# Patient Record
Sex: Male | Born: 1975 | Race: White | Hispanic: No | Marital: Single | Smoking: Current every day smoker
Health system: Southern US, Community
[De-identification: ages and names within clinical notes are randomized; demographics above are authoritative.]

## PROBLEM LIST (undated history)

## (undated) DIAGNOSIS — T7840XA Allergy, unspecified, initial encounter: Secondary | ICD-10-CM

---

## 2014-01-01 ENCOUNTER — Ambulatory Visit: Payer: Self-pay | Admitting: Family Medicine

## 2014-11-16 ENCOUNTER — Ambulatory Visit: Payer: Self-pay | Admitting: Physician Assistant

## 2014-11-16 LAB — RAPID STREP-A WITH REFLX: Micro Text Report: NEGATIVE

## 2014-11-19 LAB — BETA STREP CULTURE(ARMC)

## 2015-08-04 ENCOUNTER — Encounter: Payer: Self-pay | Admitting: Emergency Medicine

## 2015-08-04 ENCOUNTER — Ambulatory Visit
Admission: EM | Admit: 2015-08-04 | Discharge: 2015-08-04 | Disposition: A | Discharge status: Home / Self Care | Payer: BLUE CROSS/BLUE SHIELD | Attending: Family Medicine | Admitting: Family Medicine

## 2015-08-04 DIAGNOSIS — H6593 Unspecified nonsuppurative otitis media, bilateral: Secondary | ICD-10-CM | POA: Diagnosis not present

## 2015-08-04 DIAGNOSIS — J302 Other seasonal allergic rhinitis: Secondary | ICD-10-CM

## 2015-08-04 DIAGNOSIS — Z8709 Personal history of other diseases of the respiratory system: Secondary | ICD-10-CM

## 2015-08-04 DIAGNOSIS — J40 Bronchitis, not specified as acute or chronic: Secondary | ICD-10-CM | POA: Diagnosis not present

## 2015-08-04 MED ORDER — SALINE SPRAY 0.65 % NA SOLN: 2.0000 | NASAL | Status: DC

## 2015-08-04 MED ORDER — ALBUTEROL SULFATE HFA 108 (90 BASE) MCG/ACT IN AERS: 1.0000 | INHALATION_SPRAY | RESPIRATORY_TRACT | Status: DC | PRN

## 2015-08-04 MED ORDER — IBUPROFEN 800 MG PO TABS: 800.0000 mg | ORAL_TABLET | Freq: Three times a day (TID) | ORAL | Status: DC

## 2015-08-04 MED ORDER — PSEUDOEPHEDRINE HCL 30 MG PO TABS: 30.0000 mg | ORAL_TABLET | Freq: Four times a day (QID) | ORAL | Status: DC | PRN

## 2015-08-04 MED ORDER — AMOXICILLIN-POT CLAVULANATE 875-125 MG PO TABS: 1.0000 | ORAL_TABLET | Freq: Two times a day (BID) | ORAL | Status: DC

## 2015-08-04 MED ORDER — TRIAMCINOLONE ACETONIDE 55 MCG/ACT NA AERO: 2.0000 | INHALATION_SPRAY | Freq: Every day | NASAL | Status: DC

## 2015-08-04 MED ORDER — PREDNISONE 50 MG PO TABS: ORAL_TABLET | ORAL | Status: DC

## 2015-08-04 NOTE — Discharge Instructions (Signed)
Otitis Media With Effusion Otitis media with effusion is the presence of fluid in the middle ear. This is a common problem in children, which often follows ear infections. It may be present for weeks or longer after the infection. Unlike an acute ear infection, otitis media with effusion refers only to fluid behind the ear drum and not infection. Children with repeated ear and sinus infections and allergy problems are the most likely to get otitis media with effusion. CAUSES  The most frequent cause of the fluid buildup is dysfunction of the eustachian tubes. These are the tubes that drain fluid in the ears to the back of the nose (nasopharynx). SYMPTOMS   The main symptom of this condition is hearing loss. As a result, you or your child may:  Listen to the TV at a loud volume.  Not respond to questions.  Ask "what" often when spoken to.  Mistake or confuse one sound or word for another.  There may be a sensation of fullness or pressure but usually not pain. DIAGNOSIS   Your health care provider will diagnose this condition by examining you or your child's ears.  Your health care provider may test the pressure in you or your child's ear with a tympanometer.  A hearing test may be conducted if the problem persists. TREATMENT   Treatment depends on the duration and the effects of the effusion.  Antibiotics, decongestants, nose drops, and cortisone-type drugs (tablets or nasal spray) may not be helpful.  Children with persistent ear effusions may have delayed language or behavioral problems. Children at risk for developmental delays in hearing, learning, and speech may require referral to a specialist earlier than children not at risk.  You or your child's health care provider may suggest a referral to an ear, nose, and throat surgeon for treatment. The following may help restore normal hearing:  Drainage of fluid.  Placement of ear tubes (tympanostomy tubes).  Removal of adenoids  (adenoidectomy). HOME CARE INSTRUCTIONS   Avoid secondhand smoke.  Infants who are breastfed are less likely to have this condition.  Avoid feeding infants while they are lying flat.  Avoid known environmental allergens.  Avoid people who are sick. SEEK MEDICAL CARE IF:   Hearing is not better in 3 months.  Hearing is worse.  Ear pain.  Drainage from the ear.  Dizziness. MAKE SURE YOU:   Understand these instructions.  Will watch your condition.  Will get help right away if you are not doing well or get worse. Document Released: 12/19/2004 Document Revised: 03/28/2014 Document Reviewed: 06/08/2013 [facility] Patient Information 2015 [place], [place]. This information is not intended to replace advice given to you by your health care provider. Make sure you discuss any questions you have with your health care provider. Allergic Rhinitis Allergic rhinitis is when the mucous membranes in the nose respond to allergens. Allergens are particles in the air that cause your body to have an allergic reaction. This causes you to release allergic antibodies. Through a chain of events, these eventually cause you to release histamine into the blood stream. Although meant to protect the body, it is this release of histamine that causes your discomfort, such as frequent sneezing, congestion, and an itchy, runny nose.  CAUSES  Seasonal allergic rhinitis (hay fever) is caused by pollen allergens that may come from grasses, trees, and weeds. Year-round allergic rhinitis (perennial allergic rhinitis) is caused by allergens such as house dust mites, pet dander, and mold spores.  SYMPTOMS   Nasal  stuffiness (congestion).  Itchy, runny nose with sneezing and tearing of the eyes. DIAGNOSIS  Your health care provider can help you determine the allergen or allergens that trigger your symptoms. If you and your health care provider are unable to determine the allergen, skin or blood testing may be  used. TREATMENT  Allergic rhinitis does not have a cure, but it can be controlled by:  Medicines and allergy shots (immunotherapy).  Avoiding the allergen. Hay fever may often be treated with antihistamines in pill or nasal spray forms. Antihistamines block the effects of histamine. There are over-the-counter medicines that may help with nasal congestion and swelling around the eyes. Check with your health care provider before taking or giving this medicine.  If avoiding the allergen or the medicine prescribed do not work, there are many new medicines your health care provider can prescribe. Stronger medicine may be used if initial measures are ineffective. Desensitizing injections can be used if medicine and avoidance does not work. Desensitization is when a patient is given ongoing shots until the body becomes less sensitive to the allergen. Make sure you follow up with your health care provider if problems continue. HOME CARE INSTRUCTIONS It is not possible to completely avoid allergens, but you can reduce your symptoms by taking steps to limit your exposure to them. It helps to know exactly what you are allergic to so that you can avoid your specific triggers. SEEK MEDICAL CARE IF:   You have a fever.  You develop a cough that does not stop easily (persistent).  You have shortness of breath.  You start wheezing.  Symptoms interfere with normal daily activities. Document Released: 08/06/2001 Document Revised: 11/16/2013 Document Reviewed: 07/19/2013 [facility] Patient Information 2015 [place], [place]. This information is not intended to replace advice given to you by your health care provider. Make sure you discuss any questions you have with your health care provider. Acute Bronchitis Bronchitis is inflammation of the airways that extend from the windpipe into the lungs (bronchi). The inflammation often causes mucus to develop. This leads to a cough, which is the most common symptom of  bronchitis.  In acute bronchitis, the condition usually develops suddenly and goes away over time, usually in a couple weeks. Smoking, allergies, and asthma can make bronchitis worse. Repeated episodes of bronchitis may cause further lung problems.  CAUSES Acute bronchitis is most often caused by the same virus that causes a cold. The virus can spread from person to person (contagious) through coughing, sneezing, and touching contaminated objects. SIGNS AND SYMPTOMS   Cough.   Fever.   Coughing up mucus.   Body aches.   Chest congestion.   Chills.   Shortness of breath.   Sore throat.  DIAGNOSIS  Acute bronchitis is usually diagnosed through a physical exam. Your health care provider will also ask you questions about your medical history. Tests, such as chest X-rays, are sometimes done to rule out other conditions.  TREATMENT  Acute bronchitis usually goes away in a couple weeks. Oftentimes, no medical treatment is necessary. Medicines are sometimes given for relief of fever or cough. Antibiotic medicines are usually not needed but may be prescribed in certain situations. In some cases, an inhaler may be recommended to help reduce shortness of breath and control the cough. A cool mist vaporizer may also be used to help thin bronchial secretions and make it easier to clear the chest.  HOME CARE INSTRUCTIONS  Get plenty of rest.   Drink enough fluids to keep  your urine clear or pale yellow (unless you have a medical condition that requires fluid restriction). Increasing fluids may help thin your respiratory secretions (sputum) and reduce chest congestion, and it will prevent dehydration.   Take medicines only as directed by your health care provider.  If you were prescribed an antibiotic medicine, finish it all even if you start to feel better.  Avoid smoking and secondhand smoke. Exposure to cigarette smoke or irritating chemicals will make bronchitis worse. If you are a  smoker, consider using nicotine gum or skin patches to help control withdrawal symptoms. Quitting smoking will help your lungs heal faster.   Reduce the chances of another bout of acute bronchitis by washing your hands frequently, avoiding people with cold symptoms, and trying not to touch your hands to your mouth, nose, or eyes.   Keep all follow-up visits as directed by your health care provider.  SEEK MEDICAL CARE IF: Your symptoms do not improve after 1 week of treatment.  SEEK IMMEDIATE MEDICAL CARE IF:  You develop an increased fever or chills.   You have chest pain.   You have severe shortness of breath.  You have bloody sputum.   You develop dehydration.  You faint or repeatedly feel like you are going to pass out.  You develop repeated vomiting.  You develop a severe headache. MAKE SURE YOU:   Understand these instructions.  Will watch your condition.  Will get help right away if you are not doing well or get worse. Document Released: 12/19/2004 Document Revised: 03/28/2014 Document Reviewed: 05/04/2013 [facility] Patient Information 2015 [place], [place]. This information is not intended to replace advice given to you by your health care provider. Make sure you discuss any questions you have with your health care provider.

## 2015-08-04 NOTE — ED Notes (Signed)
Patient c/o cough and chest congestion and nasal congestion for over a week.  Patient denies fevers.

## 2015-08-05 NOTE — ED Provider Notes (Signed)
CSN: [id number]     Arrival date & time 08/04/15  1252 History   First MD Initiated Contact with Patient 08/04/15 1415     Chief Complaint  Patient presents with  . Cough   (Consider location/radiation/quality/duration/timing/severity/associated sxs/prior Treatment) HPI Comments: Married caucasian male here for evaluation of productive cough green white sputum; cough chest congestion starting three weeks ago nonproductive, runny nose tried zyrtec and sudafed helps to slow down drip but not stop it completely.  History seasonal allergies spring and fall; asthma as child hasn't recently used inhaler but noticed the past couple of years worsening chest symptoms with allergies; 40 year old recently started daycare and has been sick.  Patient electric lineman works outdoors has been sweating but unsure if due to protective gear/weather or infection as heat index 100s  Patient is a 39 y.o. male presenting with cough. The history is provided by the patient.  Cough Cough characteristics:  Productive Sputum characteristics:  Green and white Severity:  Moderate Onset quality:  Gradual Duration:  3 weeks Timing:  Intermittent Progression:  Unchanged Chronicity:  New Smoker: no   Context: animal exposure, exposure to allergens, fumes, occupational exposure, sick contacts, upper respiratory infection, weather changes and with activity   Context: not smoke exposure   Relieved by:  Nothing Worsened by:  Environmental changes and activity Ineffective treatments:  Decongestant, fluids, rest and cough suppressants Associated symptoms: diaphoresis, ear fullness, headaches, myalgias, rhinorrhea, sinus congestion and sore throat   Associated symptoms: no chest pain, no chills, no ear pain, no eye discharge, no fever, no rash, no shortness of breath, no weight loss and no wheezing   Headaches:    Severity:  Mild   Onset quality:  Gradual   Duration:  1 week   Timing:  Intermittent   Progression:   Unchanged   Chronicity:  New Myalgias:    Location:  Back   Quality:  Aching and stabbing   Severity:  Moderate   Onset quality:  Sudden   Duration:  2 days   Timing:  Intermittent   Progression:  Resolved Rhinorrhea:    Quality:  Green, white, yellow and clear   Severity:  Moderate   Duration:  2 weeks   Timing:  Intermittent   Progression:  Waxing and waning Sore throat:    Severity:  Mild   Onset quality:  Sudden   Duration:  1 week   Timing:  Intermittent   Progression:  Unchanged Risk factors: recent infection   Risk factors: no chemical exposure and no recent travel     History reviewed. No pertinent past medical history. History reviewed. No pertinent past surgical history. History reviewed. No pertinent family history. Social History  Substance Use Topics  . Smoking status: Current Every Day Smoker -- 0.50 packs/day    Types: Cigarettes  . Smokeless tobacco: None  . Alcohol Use: Yes    Review of Systems  Constitutional: Positive for diaphoresis and appetite change. Negative for fever, chills, weight loss, activity change and fatigue.  HENT: Positive for congestion, postnasal drip, rhinorrhea, sinus pressure, sneezing and sore throat. Negative for dental problem, drooling, ear discharge, ear pain, facial swelling, hearing loss, mouth sores, nosebleeds, tinnitus, trouble swallowing and voice change.   Eyes: Negative for photophobia, pain, discharge, redness, itching and visual disturbance.  Respiratory: Positive for cough and chest tightness. Negative for choking, shortness of breath, wheezing and stridor.   Cardiovascular: Negative for chest pain, palpitations and leg swelling.  Gastrointestinal: Negative for nausea,  vomiting, abdominal pain, diarrhea, constipation, blood in stool and abdominal distention.  Endocrine: Negative for cold intolerance and heat intolerance.  Genitourinary: Negative for dysuria, frequency and hematuria.  Musculoskeletal: Positive for  myalgias. Negative for back pain, joint swelling, arthralgias, gait problem, neck pain and neck stiffness.  Skin: Negative for color change, pallor, rash and wound.  Allergic/Immunologic: Positive for environmental allergies. Negative for food allergies.  Neurological: Positive for headaches. Negative for dizziness, tremors, seizures, syncope, facial asymmetry, speech difficulty, weakness, light-headedness and numbness.  Hematological: Negative for adenopathy. Does not bruise/bleed easily.  Psychiatric/Behavioral: Negative for behavioral problems, confusion, sleep disturbance and agitation.    Allergies  Review of patient's allergies indicates no known allergies.  Home Medications   Prior to Admission medications   Medication Sig Start Date End Date Taking? Authorizing Provider  albuterol (PROVENTIL HFA;VENTOLIN HFA) 108 (90 BASE) MCG/ACT inhaler Inhale 1-2 puffs into the lungs every 4 (four) hours as needed for wheezing or shortness of breath. 08/04/15   Barbaraann Barthel, NP  amoxicillin-clavulanate (AUGMENTIN) 875-125 MG per tablet Take 1 tablet by mouth every 12 (twelve) hours. 08/04/15   Barbaraann Barthel, NP  ibuprofen (ADVIL,MOTRIN) 800 MG tablet Take 1 tablet (800 mg total) by mouth 3 (three) times daily. 08/04/15   Barbaraann Barthel, NP  predniSONE (DELTASONE) 50 MG tablet Take 1 po daily x 5 days 08/04/15   Barbaraann Barthel, NP  pseudoephedrine (SUDAFED) 30 MG tablet Take 1 tablet (30 mg total) by mouth every 6 (six) hours as needed for congestion (max 8 tabs per 24 hours). 08/04/15   Barbaraann Barthel, NP  sodium chloride (OCEAN) 0.65 % SOLN nasal spray Place 2 sprays into both nostrils every 2 (two) hours while awake. 08/04/15   Barbaraann Barthel, NP  triamcinolone (NASACORT) 55 MCG/ACT AERO nasal inhaler Place 2 sprays into the nose daily. 08/04/15   Barbaraann Barthel, NP   Meds Ordered and Administered this Visit  Medications - No data to display  BP 133/78 mmHg  Pulse 63  Temp(Src) 98.1  F (36.7 C) (Oral)  Resp 16  Ht 5\' 8"  (1.727 m)  Wt 150 lb (68.04 kg)  BMI 22.81 kg/m2  SpO2 100% No data found.   Physical Exam  Constitutional: He is oriented to person, place, and time. Vital signs are normal. He appears well-developed and well-nourished. No distress.  HENT:  Head: Normocephalic and atraumatic.  Right Ear: Hearing, external ear and ear canal normal. A middle ear effusion is present.  Left Ear: Hearing, external ear and ear canal normal. A middle ear effusion is present.  Nose: Mucosal edema and rhinorrhea present. No nose lacerations, sinus tenderness, nasal deformity, septal deviation or nasal septal hematoma. No epistaxis.  No foreign bodies. Right sinus exhibits no maxillary sinus tenderness and no frontal sinus tenderness. Left sinus exhibits no maxillary sinus tenderness and no frontal sinus tenderness.  Mouth/Throat: Uvula is midline and mucous membranes are normal. Mucous membranes are not pale, not dry and not cyanotic. He does not have dentures. No oral lesions. No trismus in the jaw. Normal dentition. No dental abscesses, uvula swelling, lacerations or dental caries. Posterior oropharyngeal edema and posterior oropharyngeal erythema present. No oropharyngeal exudate or tonsillar abscesses.  Bilateral TMs with air fluid level slight opacity and TM vasculature inflamed; cobblestoning posterior pharynx; bilateral turbinates with edema/erythema clear discharge  Eyes: Conjunctivae, EOM and lids are normal. Pupils are equal, round, and reactive to light. Right eye exhibits no discharge. Left eye  exhibits no discharge. No scleral icterus.  Neck: Trachea normal and normal range of motion. Neck supple. No tracheal deviation present. No thyromegaly present.  Cardiovascular: Normal rate, regular rhythm, S1 normal, S2 normal, normal heart sounds and intact distal pulses.  PMI is not displaced.  Exam reveals no gallop, no friction rub and no decreased pulses.   No murmur  heard. Pulmonary/Chest: Effort normal and breath sounds normal. No accessory muscle usage or stridor. No respiratory distress. He has no decreased breath sounds. He has no wheezes. He has no rhonchi. He has no rales.  Abdominal: Soft. Bowel sounds are normal. He exhibits no shifting dullness, no distension, no pulsatile liver, no fluid wave, no abdominal bruit, no ascites, no pulsatile midline mass and no mass. There is no hepatosplenomegaly. There is no tenderness. There is no rigidity, no rebound, no guarding, no CVA tenderness, no tenderness at McBurney's point and negative Murphy's sign. Hernia confirmed negative in the ventral area.  Dull to percussion x 4 quads  Musculoskeletal: Normal range of motion. He exhibits no edema or tenderness.  Lymphadenopathy:    He has no cervical adenopathy.  Neurological: He is alert and oriented to person, place, and time. He exhibits normal muscle tone. Coordination normal.  Skin: Skin is warm, dry and intact. No abrasion, no bruising, no burn, no ecchymosis, no laceration, no lesion, no petechiae and no rash noted. He is not diaphoretic. No cyanosis or erythema. No pallor. Nails show no clubbing.  Shirt sweat soaked anterior around waist  Psychiatric: He has a normal mood and affect. His speech is normal and behavior is normal. Judgment and thought content normal. Cognition and memory are normal.  Nursing note and vitals reviewed.   ED Course  Procedures (including critical care time)  Labs Review Labs Reviewed - No data to display  Imaging Review No results found.   MDM   1. Bronchitis   2. Otitis media with effusion, bilateral   3. Other seasonal allergic rhinitis   4. History of asthma    Allergic drip causing bronchial/airway irritation.  If no relief with nasacort x 48 hours start augmentin 875mg  po BID x 10 days.  Rx given.  Motrin 800mg  po TID prn pain/headache.  Sudafed 30mg  po q4-6h prn breakthrough rhinitis on flonase.  Max 8 tabs per  24 hours.  Bronchitis simple, community acquired, may have started as viral (probably respiratory syncytial, parainfluenza, influenza, or adenovirus), but now evidence of acute purulent bronchitis with resultant bronchial edema and mucus formation.  Differential Diagnoses:  Reactive Airway Disease (asthma, allergic aspergillosis eosinophilia), chronic bronchitis, respiratory infection (sinusitis, common cold, pneumonia), congestive heart failure, smoke/irritant exposure, reflux esophagitis, bronchogenic tumor, and/or aspiration syndromes.  Without high fever, severe dyspnea and lack of physical findings or risk factors will hold on chest radiograph and CBC at this time.  I discussed that approximately 50% of patients with acute bronchitis have a cough that lasts up to three weeks, and 25% for over a month. Tylenol, one to two tablets every four hours as needed for fever or myalgias.   No aspirin. Patient instructed to follow up in one week or sooner if symptoms worsen. Patient verbalized agreement and understanding of treatment plan.  P2:  hand washing and cover cough  Supportive treatment.   No evidence of invasive bacterial infection, non toxic and well hydrated.  This is most likely self limiting viral infection.  I do not see where any further testing or imaging is necessary at this time.  I will suggest supportive care, rest, good hygiene and encourage the patient to take adequate fluids.  The patient is to return to clinic or EMERGENCY ROOM if symptoms worsen or change significantly e.g. ear pain, fever, purulent discharge from ears or bleeding.  Exitcare handout on otitis media with effusion given to patient.  Patient verbalized agreement and understanding of treatment plan.    Patient may use normal saline nasal spray as needed.  Continue zyrtec  po daily at home.  Rx Nasacort 2 sprays each nostril daily.  Avoid triggers if possible.  Shower prior to bedtime if exposed to triggers.  If allergic  dust/dust mites recommend mattress/pillow covers/encasements; washing linens, vacuuming, sweeping, dusting weekly.  Call or return to clinic as needed if these symptoms worsen or fail to improve as anticipated.   Exitcare handout on allergic rhinitis given to patient.  Patient verbalized understanding of instructions, agreed with plan of care and had no further questions at this time.  P2:  Avoidance and hand washing.  Refilled albuterol MDI 2 puffs po q4-6h prn chest tightness/wheeze.  Prednisone burst  po daily x 5 days.  If chest tightness, wheezing recurrent follow up with [facility] consider pulmonology referral repeat PFTs to see if adult reoccurence asthma.  Suspect allergy flare at this time.  Ragweed high at this time.  Discussed starting antihistamine/singulair also will hold at this time.  Medications as directed.  Patient is to return to the clinic or follow up with PCM if there is increased wheezing or shortness of breath, increased use of albuterol.  Patient educated on the importance of continuing to monitor their peak flows.  Patient given Exitcare handout on asthma.  Patient verbalized agreement and understanding of treatment plan.  P2:  Asthma action plan, fluids, and fitness  Barbaraann Barthel, NP 08/05/15 [phone]

## 2018-04-20 ENCOUNTER — Encounter: Payer: Self-pay | Admitting: Emergency Medicine

## 2018-04-20 ENCOUNTER — Ambulatory Visit
Admission: EM | Admit: 2018-04-20 | Discharge: 2018-04-20 | Disposition: A | Discharge status: Home / Self Care | Payer: BLUE CROSS/BLUE SHIELD | Attending: Family Medicine | Admitting: Family Medicine

## 2018-04-20 ENCOUNTER — Other Ambulatory Visit: Payer: Self-pay

## 2018-04-20 ENCOUNTER — Ambulatory Visit (INDEPENDENT_AMBULATORY_CARE_PROVIDER_SITE_OTHER): Payer: BLUE CROSS/BLUE SHIELD

## 2018-04-20 DIAGNOSIS — S92501A Displaced unspecified fracture of right lesser toe(s), initial encounter for closed fracture: Secondary | ICD-10-CM

## 2018-04-20 DIAGNOSIS — W208XXA Other cause of strike by thrown, projected or falling object, initial encounter: Secondary | ICD-10-CM

## 2018-04-20 DIAGNOSIS — S90811A Abrasion, right foot, initial encounter: Secondary | ICD-10-CM | POA: Diagnosis not present

## 2018-04-20 DIAGNOSIS — S90414A Abrasion, right lesser toe(s), initial encounter: Secondary | ICD-10-CM

## 2018-04-20 MED ORDER — MUPIROCIN 2 % EX OINT: TOPICAL_OINTMENT | CUTANEOUS | 0 refills | Status: DC

## 2018-04-20 MED ORDER — CEPHALEXIN 500 MG PO CAPS: 500.0000 mg | ORAL_CAPSULE | Freq: Three times a day (TID) | ORAL | 0 refills | Status: AC

## 2018-04-20 NOTE — ED Triage Notes (Signed)
Patient c/o right pinky toe injury last night after dropping a 5lb can on his toe.

## 2018-04-20 NOTE — Discharge Instructions (Addendum)
Take medication as prescribed. Keep buddy taped, and use shoe as discussed.   Follow up with your primary care physician or podiatry in 1-2 weeks. Return to Urgent care for new or worsening concerns.

## 2018-04-20 NOTE — ED Provider Notes (Signed)
[facility] ____________________________________________  Time seen: Approximately 1:55 PM  I have reviewed the triage vital signs and the nursing notes.   HISTORY  Chief Complaint Toe Injury   HPI Jeffrey Robbins is a 42 y.o. male presented for evaluation of right fifth toe pain post injury that occurred last night at home.  Patient states that he had a 5 pound can of baked beans in his hand, and excellently dropped it falling directly on his fifth toe.  Denies any other pain or injuries.  States has had some bleeding since from the toe.  Continues remain ambulatory.  Pain is worse with direct palpation and ambulation.  Denies other aggravating or alleviating factors.  Did clean the area last night. Denies recent sickness. Denies recent antibiotic use.  Reports tetanus simulation is up-to-date, last tetanus approximately 2 years ago.   History reviewed. No pertinent past medical history.  There are no active problems to display for this patient.   History reviewed. No pertinent surgical history.   No current facility-administered medications for this encounter.   Current Outpatient Medications:  .  fluticasone (FLONASE) 50 MCG/ACT nasal spray, Place into both nostrils daily., Disp: , Rfl:  .  ibuprofen (ADVIL,MOTRIN) 800 MG tablet, Take 1 tablet (800 mg total) by mouth 3 (three) times daily., Disp: 21 tablet, Rfl: 0 .  cephALEXin (KEFLEX) 500 MG capsule, Take 1 capsule (500 mg total) by mouth 3 (three) times daily for 5 days., Disp: 15 capsule, Rfl: 0 .  mupirocin ointment (BACTROBAN) 2 %, Apply two times a day for 7 days., Disp: 22 g, Rfl: 0  Allergies Patient has no known allergies.  No family history on file.  Social History Social History   Tobacco Use  . Smoking status: Former Smoker    Packs/day: 0.50    Types: Cigarettes  . Smokeless tobacco: Current User    Types: Chew  Substance Use Topics  . Alcohol use: Yes    Alcohol/week: 6.0 oz    Types:  10 Cans of beer per week  . Drug use: Never    Review of Systems Constitutional: No fever Cardiovascular: Denies chest pain. Respiratory: Denies shortness of breath. Musculoskeletal: Negative for back pain. Toe pain. Skin: as above. ____________________________________________   PHYSICAL EXAM:  VITAL SIGNS: ED Triage Vitals  Enc Vitals Group     BP 04/20/18 1243 (!) 142/92     Pulse Rate 04/20/18 1240 86     Resp 04/20/18 1240 18     Temp --      Temp src --      SpO2 04/20/18 1240 100 %     Weight 04/20/18 1243 150 lb (68 kg)     Height 04/20/18 1243  (1.727 m)     Head Circumference --      Peak Flow --      Pain Score 04/20/18 1242 4     Pain Loc --      Pain Edu? --      Excl. in GC? --     Constitutional: Alert and oriented. Well appearing and in no acute distress. ENT      Head: Normocephalic and atraumatic. Cardiovascular:Good peripheral circulation. Respiratory: Normal respiratory effort without tachypnea nor retractions. Musculoskeletal:  Steady gait.  Except: Right fifth toe mild tenderness to direct palpation, mild to moderate ecchymosis and mild edema, mild bleeding abrasions present to dorsal toe medial to toenail, normal sensation.  Right foot otherwise nontender. Neurologic:  Normal speech and  language. No gross focal neurologic deficits are appreciated. Speech is normal. No gait instability.  Skin:  Skin is warm, dry. As above.  Psychiatric: Mood and affect are normal. Speech and behavior are normal. Patient exhibits appropriate insight and judgment   ___________________________________________   LABS (all labs ordered are listed, but only abnormal results are displayed)  Labs Reviewed - No data to display ____________________________________________ RADIOLOGY  Dg Toe 5th Right  Result Date: 04/20/2018 CLINICAL DATA:  Patient c/o right pinky toe injury last night after dropping a 5lb can on his toe. EXAM: RIGHT FIFTH TOE COMPARISON:  None.  FINDINGS: There is a comminuted fracture of the middle and distal phalanges of the fifth toe. There is developmental fusion across the DIP joint with the fracture extending from the distal aspect of the middle phalanx throughout the distal phalanx. There is no significant fracture displacement or angulation. No other fractures. The PIP joint is normally spaced and aligned, not involved by fracture. There is surrounding soft tissue swelling. IMPRESSION: 1. Comminuted fracture of the middle and distal phalanges of the right fifth toe without significant displacement or angulation. No dislocation. Electronically Signed   By: Amie Portland M.D.   On: 04/20/2018 13:12   ____________________________________________   PROCEDURES Procedures   INITIAL IMPRESSION / ASSESSMENT AND PLAN / ED COURSE  Pertinent labs & imaging results that were available during my care of the patient were reviewed by me and considered in my medical decision making (see chart for details).  Well-appearing patient.  No acute distress.  Right fifth toe pain post mechanical injury that occurred last night at home.  X-ray as above per radiologist, comminuted fracture of the middle to the distal phalanx of the right fifth toe.  Break in skin, appears superficial, however with comminuted fracture and injury greater than 12 hours old, will empirically place on oral Keflex, topical Bactroban.  Dressing applied and fourth and fifth toes buddy taped and postoperative shoe given.  Follow-up with podiatry or primary in 1 to 2 weeks.  Encourage supportive care.  Work note given for tomorrow.Discussed indication, risks and benefits of medications with patient.   Discussed follow up and return parameters including no resolution or any worsening concerns. Patient verbalized understanding and agreed to plan.   ____________________________________________   FINAL CLINICAL IMPRESSION(S) / ED DIAGNOSES  Final diagnoses:  Closed fracture of  phalanx of right fifth toe, initial encounter  Abrasion of toe of right foot, initial encounter     ED Discharge Orders        Ordered    cephALEXin (KEFLEX) 500 MG capsule  3 times daily     04/20/18 1347    mupirocin ointment (BACTROBAN) 2 %     04/20/18 1347       Note: This dictation was prepared with Dragon dictation along with smaller phrase technology. Any transcriptional errors that result from this process are unintentional.         Renford Dills, NP 04/20/18 1359

## 2019-02-05 IMAGING — CR DG TOE 5TH 2+V*R*
3 series · 3 of 3 positions shown · non-contrast
Comparison: None.

CLINICAL DATA: Patient c/o right pinky toe injury last night after
dropping a 5lb can on his toe.

EXAM:
RIGHT FIFTH TOE

[toe ap]
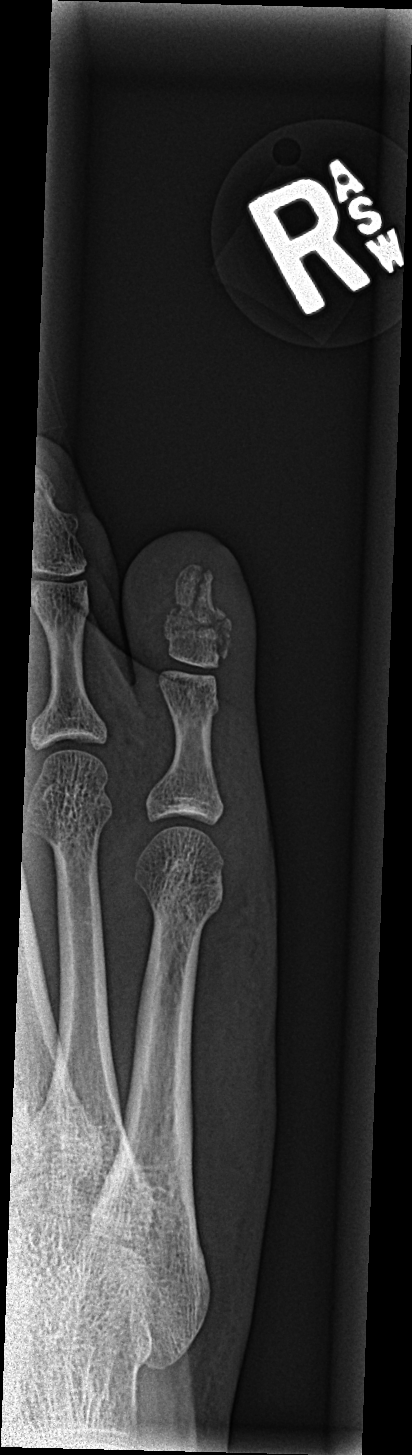

[toe obl]
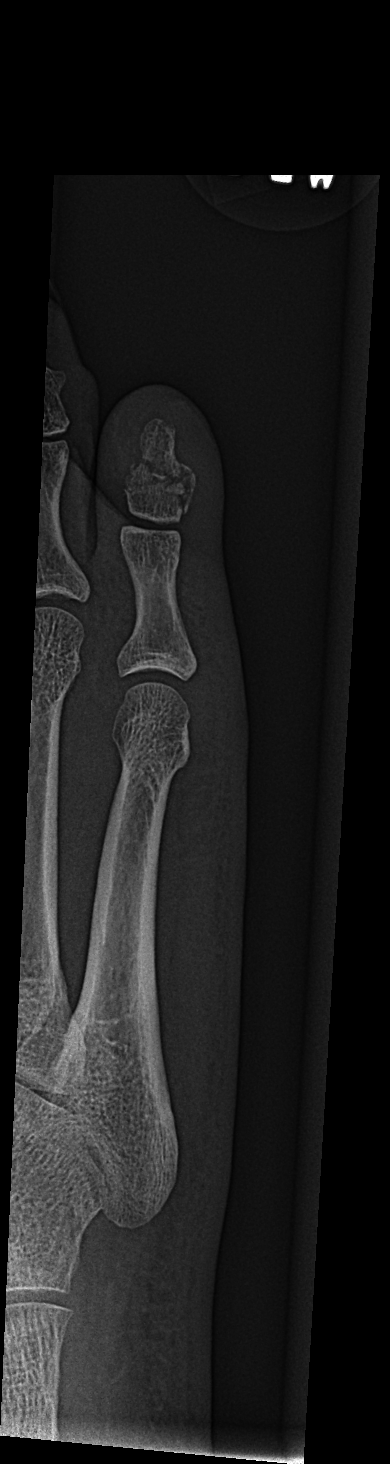

[toe lat]
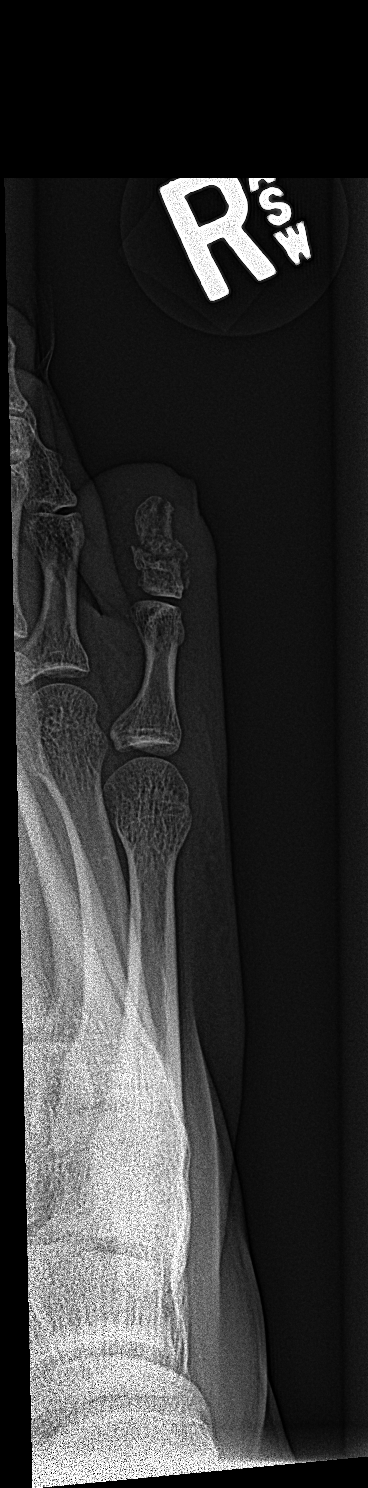

[3 of 3 positions shown; findings below may reference images not displayed]

FINDINGS: There is a comminuted fracture of the middle and distal phalanges of
the fifth toe. There is developmental fusion across the DIP joint
with the fracture extending from the distal aspect of the middle
phalanx throughout the distal phalanx. There is no significant
fracture displacement or angulation.

No other fractures. The PIP joint is normally spaced and aligned,
not involved by fracture.

There is surrounding soft tissue swelling.
IMPRESSION: 1. Comminuted fracture of the middle and distal phalanges of the
right fifth toe without significant displacement or angulation. No
dislocation.

## 2019-09-17 ENCOUNTER — Encounter: Payer: Self-pay | Admitting: Emergency Medicine

## 2019-09-17 ENCOUNTER — Other Ambulatory Visit: Payer: Self-pay

## 2019-09-17 ENCOUNTER — Ambulatory Visit
Admission: EM | Admit: 2019-09-17 | Discharge: 2019-09-17 | Disposition: A | Discharge status: Home / Self Care | Payer: 59 | Attending: Urgent Care | Admitting: Urgent Care

## 2019-09-17 ENCOUNTER — Ambulatory Visit (INDEPENDENT_AMBULATORY_CARE_PROVIDER_SITE_OTHER): Payer: 59

## 2019-09-17 DIAGNOSIS — R0789 Other chest pain: Secondary | ICD-10-CM | POA: Diagnosis not present

## 2019-09-17 DIAGNOSIS — M545 Low back pain: Secondary | ICD-10-CM

## 2019-09-17 DIAGNOSIS — M940 Chondrocostal junction syndrome [Tietze]: Secondary | ICD-10-CM | POA: Diagnosis not present

## 2019-09-17 MED ORDER — KETOROLAC TROMETHAMINE 10 MG PO TABS: 10.0000 mg | ORAL_TABLET | Freq: Three times a day (TID) | ORAL | 0 refills | Status: DC | PRN

## 2019-09-17 MED ORDER — TRAMADOL HCL 50 MG PO TABS: 50.0000 mg | ORAL_TABLET | Freq: Two times a day (BID) | ORAL | 0 refills | Status: AC | PRN

## 2019-09-17 NOTE — ED Triage Notes (Signed)
Patient c/o left sided rib and back pain that started 2 weeks ago.  Patient states that it started after he had cough and chest congestion that started over over 3 weeks ago.  Patient finished his antibiotic about 2 weeks ago along with Prednisone.  Patient denies fevers.

## 2019-09-17 NOTE — Discharge Instructions (Addendum)
It was very nice seeing you today in clinic. Thank you for entrusting me with your care.   Apply heat/ice TID-QID for at least 15-20 minutes at a time to help with your pain. Please utilize the medications that we discussed. Your prescriptions has been called in to your pharmacy.   Make arrangements to follow up with your regular doctor in 1 week for re-evaluation if not improving. If your symptoms/condition worsens, please seek follow up care either here or in the ER. Please remember, our [facility] providers are "right here with you" when you need [place].   Again, it was my pleasure to take care of you today. Thank you for choosing our clinic. I hope that you start to feel better quickly.   Honor Loh, MSN, APRN, FNP-C, CEN Advanced Practice Provider [facility]

## 2019-09-17 NOTE — ED Provider Notes (Signed)
[place],    Name: Jeffrey Robbins DOB: [birth year] MRN: [id number] CSN: [id number] PCP: System, Pcp Not In  Arrival date and time:  09/17/19 0805  Chief Complaint:  Rib pain and Back Pain   NOTE: Prior to seeing the patient today, I have reviewed the triage nursing documentation and vital signs. Clinical staff has updated patient's PMH/PSHx, current medication list, and drug allergies/intolerances to ensure comprehensive history available to assist in medical decision making.   History:   HPI: Jeffrey Robbins is a 43 y.o. male who presents today with complaints of pain his his LEFT lateral chest wall and thoracic back that began approximately 2 weeks ago. Patient reports that he was treated for acute bronchitis about 3 weeks ago. SARS-CoV-2 (novel coronavirus) testing was negative on 07/30/2019. During the course of his illness, patient reports deep and forceful coughing. He was treated with a course of amoxicillin and prednisone, which improved his respiratory infection, however he continued to have the aforementioned pain. He denies any recurrent fevers or shortness of breath. Pain worse with movement, coughing, and deep inspiration. Patient is a daily smoker with no known underlying lung disease; no COPD or asthma. In efforts to conservatively manage his symptoms at home, the patient notes that he has used IBU, which has not helped to improve his symptoms.    History reviewed. No pertinent past medical history.  History reviewed. No pertinent surgical history.  Family History  Problem Relation Age of Onset   Healthy Mother    Healthy Father     Social History   Tobacco Use   Smoking status: Current Every Day Smoker    Packs/day: 0.50    Types: Cigarettes   Smokeless tobacco: Current User    Types: Chew  Substance Use Topics   Alcohol use: Yes    Alcohol/week: 10.0 standard drinks    Types: 10 Cans of beer per week   Drug use: Never    There are no active  problems to display for this patient.   Home Medications:    Current Meds  Medication Sig   fluticasone (FLONASE) 50 MCG/ACT nasal spray Place into both nostrils daily.    Allergies:   Patient has no known allergies.  Review of Systems (ROS): Review of Systems  Constitutional: Negative for fatigue and fever.  HENT: Negative for congestion, ear pain, postnasal drip, rhinorrhea, sinus pressure, sinus pain, sneezing and sore throat.   Eyes: Negative for pain, discharge and redness.  Respiratory: Negative for cough, chest tightness and shortness of breath.   Cardiovascular: Positive for chest pain (LEFT lateral wall). Negative for palpitations.  Gastrointestinal: Negative for abdominal pain, diarrhea, nausea and vomiting.  Musculoskeletal: Positive for back pain. Negative for arthralgias, myalgias and neck pain.  Skin: Negative for color change, pallor and rash.  Neurological: Negative for dizziness, syncope, weakness and headaches.  Hematological: Negative for adenopathy.     Vital Signs: Today's Vitals   09/17/19 0820 09/17/19 0823 09/17/19 0914  BP:  125/90   Pulse:  82   Resp:  16   Temp:  98.7 F (37.1 C)   TempSrc:  Oral   SpO2:  100%   Weight: 150 lb (68 kg)    Height: 5\' 8"  (1.727 m)    PainSc: 0-No pain  0-No pain    Physical Exam: Physical Exam  Constitutional: He is oriented to person, place, and time and well-developed, well-nourished, and in no distress. No distress.  HENT:  Head: Normocephalic and atraumatic.  Eyes: Pupils are equal, round, and reactive to light. EOM are normal.  Neck: Normal range of motion. Neck supple.  Cardiovascular: Normal rate, regular rhythm, normal heart sounds and intact distal pulses. Exam reveals no gallop and no friction rub.  No murmur heard. Pulmonary/Chest: Effort normal. No respiratory distress. He has no decreased breath sounds. He has no wheezes. He has no rhonchi. He has no rales. He exhibits tenderness (LEFT lateral  chest wall). He exhibits no crepitus, no edema, no deformity and no swelling.  Abdominal: Soft. Normal appearance and bowel sounds are normal. He exhibits no distension. There is no abdominal tenderness.  Musculoskeletal: Normal range of motion.     Thoracic back: He exhibits pain. He exhibits no swelling, no deformity and no spasm.       Back:  Neurological: He is alert and oriented to person, place, and time. Gait normal.  Skin: Skin is warm and dry. No rash noted. He is not diaphoretic.  Psychiatric: Mood, memory, affect and judgment normal.  Nursing note and vitals reviewed.   Urgent Care Treatments / Results:   LABS: PLEASE NOTE: all labs that were ordered this encounter are listed, however only abnormal results are displayed. Labs Reviewed - No data to display  EKG: -None  RADIOLOGY: Dg Chest 2 View  Result Date: 09/17/2019 CLINICAL DATA:  Left posterior rib pain and intermittent shortness of breath. History of smoking. EXAM: CHEST - 2 VIEW COMPARISON:  None available. FINDINGS: The cardiac silhouette, mediastinal and hilar contours are within normal limits. The lungs are clear of an acute process such as infiltrate or effusion. No pneumothorax or worrisome pulmonary lesions. Streaky linear scarring changes are noted in the left mid lung laterally which could be post pneumonic changes. The bony thorax is intact. No definite rib fractures or rib lesions. The thoracic vertebral bodies are normally aligned. IMPRESSION: No acute cardiopulmonary findings. Suspect minimal linear scarring changes in the left lung laterally. Electronically Signed   By: Marijo Sanes M.D.   On: 09/17/2019 08:54    PROCEDURES: Procedures  MEDICATIONS RECEIVED THIS VISIT: Medications - No data to display  PERTINENT CLINICAL COURSE NOTES/UPDATES:   Initial Impression / Assessment and Plan / Urgent Care Course:  Pertinent labs & imaging results that were available during my care of the patient were  personally reviewed by me and considered in my medical decision making (see lab/imaging section of note for values and interpretations).  Jeffrey Robbins is a 43 y.o. male who presents to [facility] today with complaints of Rib pain and Back Pain   Patient is well appearing overall in clinic today. He does not appear to be in any acute distress. Presenting symptoms (see HPI) and exam as documented above. Radiographs of the chest revealed no acute cardiopulmonary process; no evidence of peribronchial thickening, areas of consolidation, or focal infiltrates. There was no evidence of PTX or rib fractures. SPO2 100% on RA. Cough has resolved. Given recent illness that caused forceful coughing, suspect costochondritis. He has been taking IBU without relief. Will change NSAID to ketorolac and provide a short term supply of tramadol for PRN use.  He was encouraged to apply heat TID-QID for at least 15-20 minutes at a time.  Discussed follow up with primary care physician in 1 week for re-evaluation. I have reviewed the follow up and strict return precautions for any new or worsening symptoms. Patient is aware of symptoms that would be deemed urgent/emergent, and would thus require further evaluation  either here or in the emergency department. At the time of discharge, he verbalized understanding and consent with the discharge plan as it was reviewed with him. All questions were fielded by provider and/or clinic staff prior to patient discharge.    Final Clinical Impressions / Urgent Care Diagnoses:   Final diagnoses:  Costochondritis   New Prescriptions:  [place] Controlled Substance Registry consulted? Yes, I have consulted the [place] Controlled Substances Registry for this patient, and feel the risk/benefit ratio today is favorable for proceeding with this prescription for a controlled substance.   Discussed use of controlled substance medication to treat his acute pain.  o Reviewed [place] STOP Act  regulations  o Clinic does not refill controlled substances over the phone without face to face evaluation.   Safety precautions reviewed.  o Medications should not be bitten, chewed, sold, or taken with alcohol.  o Avoid use while working, driving, or operating heavy machinery.  o Side effects associated with the use of this particular medication reviewed. - Patient understands that this medication can cause CNS depression, increase his risk of falls, and even lead to overdose that may result in death, if used outside of the parameters that he and I discussed.  With all of this in mind, he knowingly accepts the risks and responsibilities associated with intended course of treatment, and elects to responsibly proceed as discussed.  Meds ordered this encounter  Medications   ketorolac (TORADOL) 10 MG tablet    Sig: Take 1 tablet (10 mg total) by mouth every 8 (eight) hours as needed.    Dispense:  21 tablet    Refill:  0   traMADol (ULTRAM) 50 MG tablet    Sig: Take 1 tablet (50 mg total) by mouth every 12 (twelve) hours as needed.    Dispense:  15 tablet    Refill:  0    Recommended Follow up Care:  Patient encouraged to follow up with the following provider within the specified time frame, or sooner as dictated by the severity of his symptoms. As always, he was instructed that for any urgent/emergent care needs, he should seek care either here or in the emergency department for more immediate evaluation.  Follow-up Information    PCP In 1 week.   Why: General reassessment of symptoms if not improving        NOTE: This note was prepared using Scientist, clinical (histocompatibility and immunogenetics) along with smaller Lobbyist. Despite my best ability to proofread, there is the potential that transcriptional errors may still occur from this process, and are completely unintentional.    Verlee Monte, NP 09/18/19 [phone]

## 2022-10-29 ENCOUNTER — Ambulatory Visit (INDEPENDENT_AMBULATORY_CARE_PROVIDER_SITE_OTHER): Payer: 59

## 2022-10-29 ENCOUNTER — Ambulatory Visit (INDEPENDENT_AMBULATORY_CARE_PROVIDER_SITE_OTHER): Payer: 59 | Admitting: Podiatry

## 2022-10-29 DIAGNOSIS — Z01818 Encounter for other preprocedural examination: Secondary | ICD-10-CM | POA: Diagnosis not present

## 2022-10-29 DIAGNOSIS — M79671 Pain in right foot: Secondary | ICD-10-CM

## 2022-10-29 DIAGNOSIS — M2041 Other hammer toe(s) (acquired), right foot: Secondary | ICD-10-CM | POA: Diagnosis not present

## 2022-10-30 ENCOUNTER — Telehealth: Payer: Self-pay

## 2022-10-30 NOTE — Telephone Encounter (Signed)
Received surgery paperwork from the [place] office. Spoke to [place] and he is not sure when he wants to schedule the office surgery with Dr. Allena Katz. He stated he will call me back.

## 2022-11-12 NOTE — Progress Notes (Signed)
Subjective:  Patient ID: Jeffrey Robbins, male    DOB: 08-16-1976,  MRN: [id number]  Chief Complaint  Patient presents with   Toe Pain    Right foot 5th toe discomfort     46 y.o. male presents with the above complaint.  Patient presents with right fifth digit hammertoe contracture with a history of injury.  Patient states there is discomfort associated with it.  Hurts with rubbing against his shoes.  He would like to discuss treatment options for it.  He would like to discuss surgical options.  He denies seeing anyone else prior to seeing me denies any other acute complaints.  He has tried all conservative treatment options none of which has helped he would like to discuss surgical options at this time   Review of Systems: Negative except as noted in the HPI. Denies N/V/F/Ch.  No past medical history on file.  Current Outpatient Medications:    fluticasone (FLONASE) 50 MCG/ACT nasal spray, Place into both nostrils daily., Disp: , Rfl:    ketorolac (TORADOL) 10 MG tablet, Take 1 tablet (10 mg total) by mouth every 8 (eight) hours as needed., Disp: 21 tablet, Rfl: 0   traMADol (ULTRAM) 50 MG tablet, Take 1 tablet (50 mg total) by mouth every 12 (twelve) hours as needed., Disp: 15 tablet, Rfl: 0  Social History   Tobacco Use  Smoking Status Every Day   Packs/day: 0.50   Types: Cigarettes  Smokeless Tobacco Current   Types: Chew    No Known Allergies Objective:  There were no vitals filed for this visit. There is no height or weight on file to calculate BMI. Constitutional Well developed. Well nourished.  Vascular Dorsalis pedis pulses palpable bilaterally. Posterior tibial pulses palpable bilaterally. Capillary refill normal to all digits.  No cyanosis or clubbing noted. Pedal hair growth normal.  Neurologic Normal speech. Oriented to person, place, and time. Epicritic sensation to light touch grossly present bilaterally.  Dermatologic Nails well groomed and normal in  appearance. No open wounds. No skin lesions.  Orthopedic: Right fifth digit hammertoe contracture malaligned secondary to history of fracture.  Semiflexible in nature   Radiographs: 3 views of skeletally mature the right foot: Hammertoe contracture with adductovarus deformity noted to right fifth digit synostosis noted of the DIPJ joint.  No other bony abnormalities identified Assessment:   1. Hammertoe of right foot   2. Encounter for preoperative examination for general surgical procedure    Plan:  Patient was evaluated and treated and all questions answered.  Right fifth digit hammertoe contracture -All questions and concerns were discussed with the patient in extensive detail -Given that he has failed all conservative treatment options he will benefit from surgical options to fix the hammertoe deformity.  I discussed my treatment plan in extensive detail I believe he will benefit from office procedure with local anesthesia and correction/arthroplasty of the fifth digit.  I discussed my preoperative intra postoperative plan in extensive detail he states understanding like to proceed with surgery -Informed surgical risk consent was reviewed and read aloud to the patient.  I reviewed the films.  I have discussed my findings with the patient in great detail.  I have discussed all risks including but not limited to infection, stiffness, scarring, limp, disability, deformity, damage to blood vessels and nerves, numbness, poor healing, need for braces, arthritis, chronic pain, amputation, death.  All benefits and realistic expectations discussed in great detail.  I have made no promises as to the outcome.  I have provided realistic expectations.  I have offered the patient a 2nd opinion, which they have declined and assured me they preferred to proceed despite the risks   No follow-ups on file.

## 2022-11-28 ENCOUNTER — Telehealth: Payer: Self-pay | Admitting: Podiatry

## 2022-11-28 NOTE — Telephone Encounter (Addendum)
DOS: 12/25/2022  [employer] Effective 11/25/2022  Hammertoe Repair 5th Rt ([id number])  Deductible: $2,500 with $0 met Out-of-Pocket: $5,000 with $0 met CoInsurance: 20% Copay: $0  Authorization #: [id number] Authorization Valid: 12/25/2022 Only

## 2022-12-25 ENCOUNTER — Ambulatory Visit (INDEPENDENT_AMBULATORY_CARE_PROVIDER_SITE_OTHER): Payer: 59 | Admitting: Podiatry

## 2022-12-25 ENCOUNTER — Encounter: Payer: Self-pay | Admitting: Podiatry

## 2022-12-25 ENCOUNTER — Telehealth: Payer: Self-pay | Admitting: *Deleted

## 2022-12-25 DIAGNOSIS — M2041 Other hammer toe(s) (acquired), right foot: Secondary | ICD-10-CM

## 2022-12-25 MED ORDER — OXYCODONE-ACETAMINOPHEN 5-325 MG PO TABS: 1.0000 | ORAL_TABLET | ORAL | 0 refills | Status: AC | PRN

## 2022-12-25 MED ORDER — IBUPROFEN 800 MG PO TABS: 800.0000 mg | ORAL_TABLET | Freq: Four times a day (QID) | ORAL | 1 refills | Status: DC | PRN

## 2022-12-25 NOTE — Telephone Encounter (Signed)
Patient is calling to ask for pain medicine and what to do moving forward with care of his foot. .Please advise.

## 2023-01-01 ENCOUNTER — Ambulatory Visit (INDEPENDENT_AMBULATORY_CARE_PROVIDER_SITE_OTHER): Payer: 59

## 2023-01-01 ENCOUNTER — Ambulatory Visit (INDEPENDENT_AMBULATORY_CARE_PROVIDER_SITE_OTHER): Payer: 59 | Admitting: Podiatry

## 2023-01-01 DIAGNOSIS — M2041 Other hammer toe(s) (acquired), right foot: Secondary | ICD-10-CM | POA: Diagnosis not present

## 2023-01-01 DIAGNOSIS — Z9889 Other specified postprocedural states: Secondary | ICD-10-CM

## 2023-01-01 NOTE — Progress Notes (Signed)
  Subjective:  Patient ID: Jeffrey Robbins, male    DOB: [birth year],  MRN: [id number]  Chief Complaint  Patient presents with   Routine Post Op    POV#1 DOS 1.31.24 HAMMERTOE REPAIR 5TH    DOS: 12/25/2022 Procedure: Right fifth digit hammertoe arthroplasty  47 y.o. male returns for post-op check.  He states is doing okay.  The bandage with little discomfort.  But was able to wear the surgical shoe.  Pain well-controlled.  Review of Systems: Negative except as noted in the HPI. Denies N/V/F/Ch.  No past medical history on file.  Current Outpatient Medications:    fluticasone (FLONASE) 50 MCG/ACT nasal spray, Place into both nostrils daily., Disp: , Rfl:    ibuprofen (ADVIL) 800 MG tablet, Take 1 tablet (800 mg total) by mouth every 6 (six) hours as needed., Disp: 60 tablet, Rfl: 1   ketorolac (TORADOL) 10 MG tablet, Take 1 tablet (10 mg total) by mouth every 8 (eight) hours as needed., Disp: 21 tablet, Rfl: 0   oxyCODONE-acetaminophen (PERCOCET) 5-325 MG tablet, Take 1 tablet by mouth every 4 (four) hours as needed for severe pain., Disp: 30 tablet, Rfl: 0   traMADol (ULTRAM) 50 MG tablet, Take 1 tablet (50 mg total) by mouth every 12 (twelve) hours as needed., Disp: 15 tablet, Rfl: 0  Social History   Tobacco Use  Smoking Status Every Day   Packs/day: 0.50   Types: Cigarettes  Smokeless Tobacco Current   Types: Chew    No Known Allergies Objective:  There were no vitals filed for this visit. There is no height or weight on file to calculate BMI. Constitutional Well developed. Well nourished.  Vascular Foot warm and well perfused. Capillary refill normal to all digits.   Neurologic Normal speech. Oriented to person, place, and time. Epicritic sensation to light touch grossly present bilaterally.  Dermatologic Skin healing well without signs of infection. Skin edges well coapted without signs of infection.  Orthopedic: Tenderness to palpation noted about the surgical site.    Radiographs: 3 views of skeletally mature the right foot: Arthroplasty of the fifth digit noted.  Good correction alignment noted. Assessment:   1. Hammertoe of right foot   2. Status post foot surgery    Plan:  Patient was evaluated and treated and all questions answered.  S/p foot surgery right -Progressing as expected post-operatively. -XR: See above -WB Status: Weightbearing as tolerated in surgical shoe -Sutures: Intact.  No clinical signs of dehiscence noted no complication noted. -Medications: None -Foot redressed.  No follow-ups on file.

## 2023-01-02 NOTE — Progress Notes (Signed)
Surgeon: Dr. Boneta Lucks Assistants: None Pre-operative diagnosis: Right fifth digit hammertoe contracture Post-operative diagnosis: same Procedure: Right fifth digit PIPJ arthroplasty Pathology: None Pertinent Intra-op findings: Right fifth digit hammertoe contracture noted Anesthesia: 6 cc of half percent Marcaine plain 1% lidocaine plain Hemostasis: Calf tourniquet for 12 minutes EBL: 10 cc Materials: 3-0 Monocryl 3-0 nylon Injectables: None Complications: None  Indications for surgery: A 47 y.o. male presents with painful right fifth digit hammertoe contracture. Patient has failed all conservative therapy including but not limited to offloading padding protecting shoe gear modification. He wishes to have surgical correction of the foot/deformity. It was determined that patient would benefit from right fifth digit hammertoe arthroplasty. Informed surgical risk consent was reviewed and read aloud to the patient.  I reviewed the films.  I have discussed my findings with the patient in great detail.  I have discussed all risks including but not limited to infection, stiffness, scarring, limp, disability, deformity, damage to blood vessels and nerves, numbness, poor healing, need for braces, arthritis, chronic pain, amputation, death.  All benefits and realistic expectations discussed in great detail.  I have made no promises as to the outcome.  I have provided realistic expectations.  I have offered the patient a 2nd opinion, which they have declined and assured me they preferred to proceed despite the risks   Procedure in detail: The patient was both verbally and visually identified by myself, the nursing staff, and anesthesia staff in the preoperative holding area. They were then transferred to the operating room and placed on the operative table in supine position.  Attention was directed to the right fifth digit, semielliptical incision was delineated on the right lateral fifth digit from  distal lateral to proximal medial.  Using #15 blade incision was carried down from epidermal dermal junction down to the level of the tendon.  A transverse tenotomy was performed with exposure of PIPJ joint.  Using sagittal saw the head of the proximal phalanx was resected in standard technique and discarded.  At this time adequate correction was noted of the digit.  Using 3-0 Monocryl the extensor tendon was repaired.  Using 3-0 nylon the incision was primarily closed.  Good rectus alignment and correction noted of the digit.  The tourniquet was deflated at a total time of 12 minutes.  The incision was dressed with Kerlix Kling Ace bandage.  All bony prominences were adequately padded  At the conclusion of the procedure the patient was awoken from anesthesia and found to have tolerated the procedure well any complications. There were transferred to PACU with vital signs stable and vascular status intact.  Boneta Lucks, DPM

## 2023-01-03 ENCOUNTER — Encounter: Payer: 59 | Admitting: Podiatry

## 2023-01-17 ENCOUNTER — Ambulatory Visit (INDEPENDENT_AMBULATORY_CARE_PROVIDER_SITE_OTHER): Payer: 59 | Admitting: Podiatry

## 2023-01-17 DIAGNOSIS — M2041 Other hammer toe(s) (acquired), right foot: Secondary | ICD-10-CM

## 2023-01-17 DIAGNOSIS — Z9889 Other specified postprocedural states: Secondary | ICD-10-CM

## 2023-01-17 NOTE — Progress Notes (Signed)
  Subjective:  Patient ID: Jeffrey Robbins, male    DOB: 1976/03/04,  MRN: [id number]  Chief Complaint  Patient presents with   Routine Post Op    Suture removal     DOS: 12/25/2022 Procedure: Right fifth digit hammertoe arthroplasty  48 y.o. male returns for post-op check.  He states is doing okay.  The bandage with little discomfort.  But was able to wear the surgical shoe.  Pain well-controlled.  Review of Systems: Negative except as noted in the HPI. Denies N/V/F/Ch.  No past medical history on file.  Current Outpatient Medications:    fluticasone (FLONASE) 50 MCG/ACT nasal spray, Place into both nostrils daily., Disp: , Rfl:    ibuprofen (ADVIL) 800 MG tablet, Take 1 tablet (800 mg total) by mouth every 6 (six) hours as needed., Disp: 60 tablet, Rfl: 1   ketorolac (TORADOL) 10 MG tablet, Take 1 tablet (10 mg total) by mouth every 8 (eight) hours as needed., Disp: 21 tablet, Rfl: 0   oxyCODONE-acetaminophen (PERCOCET) 5-325 MG tablet, Take 1 tablet by mouth every 4 (four) hours as needed for severe pain., Disp: 30 tablet, Rfl: 0   traMADol (ULTRAM) 50 MG tablet, Take 1 tablet (50 mg total) by mouth every 12 (twelve) hours as needed., Disp: 15 tablet, Rfl: 0  Social History   Tobacco Use  Smoking Status Every Day   Packs/day: 0.50   Types: Cigarettes  Smokeless Tobacco Current   Types: Chew    No Known Allergies Objective:  There were no vitals filed for this visit. There is no height or weight on file to calculate BMI. Constitutional Well developed. Well nourished.  Vascular Foot warm and well perfused. Capillary refill normal to all digits.   Neurologic Normal speech. Oriented to person, place, and time. Epicritic sensation to light touch grossly present bilaterally.  Dermatologic Skin incision completely reepithelialized.  No signs of dehiscence noted no complication noted reduction of hammertoe deformity noted  Orthopedic: No further tenderness to palpation noted  about the surgical site.   Radiographs: 3 views of skeletally mature the right foot: Arthroplasty of the fifth digit noted.  Good correction alignment noted. Assessment:   No diagnosis found.  Plan:  Patient was evaluated and treated and all questions answered.  S/p foot surgery right -Progressing as expected post-operatively. -XR: See above -WB Status: Weightbearing as tolerated in surgical shoe -Sutures: Removed no clinical signs of dehiscence noted no complication noted. -Medications: None -Good correction alignment noted patient is officially discharged from my care if any foot and ankle issues on future he will come back and see me  No follow-ups on file.

## 2023-06-26 ENCOUNTER — Ambulatory Visit (INDEPENDENT_AMBULATORY_CARE_PROVIDER_SITE_OTHER): Payer: 59 | Admitting: Podiatry

## 2023-06-26 DIAGNOSIS — M722 Plantar fascial fibromatosis: Secondary | ICD-10-CM | POA: Diagnosis not present

## 2023-06-26 DIAGNOSIS — Q666 Other congenital valgus deformities of feet: Secondary | ICD-10-CM | POA: Diagnosis not present

## 2023-06-26 NOTE — Progress Notes (Signed)
Subjective:  Patient ID: Jeffrey Robbins, male    DOB: 24-Jun-1976,  MRN: [id number]  Chief Complaint  Patient presents with   Foot Pain    47 y.o. male presents with the above complaint.  Patient presents with left Planter fasciitis.  Patient states it came out of nowhere has progressive gotten worse worse with ambulation worse with pressure he would like to discuss treatment options for it.  He wears boots all day pain scale 7 out of 10 dull achy in nature he does not wear any orthotics   Review of Systems: Negative except as noted in the HPI. Denies N/V/F/Ch.  No past medical history on file.  Current Outpatient Medications:    fluticasone (FLONASE) 50 MCG/ACT nasal spray, Place into both nostrils daily., Disp: , Rfl:    ibuprofen (ADVIL) 800 MG tablet, Take 1 tablet (800 mg total) by mouth every 6 (six) hours as needed., Disp: 60 tablet, Rfl: 1   ketorolac (TORADOL) 10 MG tablet, Take 1 tablet (10 mg total) by mouth every 8 (eight) hours as needed., Disp: 21 tablet, Rfl: 0   oxyCODONE-acetaminophen (PERCOCET) 5-325 MG tablet, Take 1 tablet by mouth every 4 (four) hours as needed for severe pain., Disp: 30 tablet, Rfl: 0   traMADol (ULTRAM) 50 MG tablet, Take 1 tablet (50 mg total) by mouth every 12 (twelve) hours as needed., Disp: 15 tablet, Rfl: 0  Social History   Tobacco Use  Smoking Status Every Day   Current packs/day: 0.50   Types: Cigarettes  Smokeless Tobacco Current   Types: Chew    No Known Allergies Objective:  There were no vitals filed for this visit. There is no height or weight on file to calculate BMI. Constitutional Well developed. Well nourished.  Vascular Dorsalis pedis pulses palpable bilaterally. Posterior tibial pulses palpable bilaterally. Capillary refill normal to all digits.  No cyanosis or clubbing noted. Pedal hair growth normal.  Neurologic Normal speech. Oriented to person, place, and time. Epicritic sensation to light touch grossly  present bilaterally.  Dermatologic Nails well groomed and normal in appearance. No open wounds. No skin lesions.  Orthopedic: Normal joint ROM without pain or crepitus bilaterally. No visible deformities. Tender to palpation at the calcaneal tuber left. No pain with calcaneal squeeze left. Ankle ROM diminished range of motion left. Silfverskiold Test: positive left.   Radiographs: None  Assessment:   1. Pes planovalgus   2. Plantar fasciitis, left    Plan:  Patient was evaluated and treated and all questions answered.  Plantar Fasciitis, left - XR reviewed as above.  - Educated on icing and stretching. Instructions given.  - Injection delivered to the plantar fascia as below. - DME: Plantar fascial brace dispensed to support the medial longitudinal arch of the foot and offload pressure from the heel and prevent arch collapse during weightbearing - Pharmacologic management: None  Pes planovalgus -I explained to patient the etiology of pes planovalgus and relationship with Planter fasciitis and various treatment options were discussed.  Given patient foot structure in the setting of Planter fasciitis I believe patient will benefit from custom-made orthotics to help control the hindfoot motion support the arch of the foot and take the stress away from plantar fascial.  Patient agrees with the plan like to proceed with orthotics -Patient was casted for orthotics   Procedure: Injection Tendon/Ligament Location: Left plantar fascia at the glabrous junction; medial approach. Skin Prep: alcohol Injectate: 0.5 cc 0.5% marcaine plain, 0.5 cc of 1% Lidocaine, 0.5  cc kenalog 10. Disposition: Patient tolerated procedure well. Injection site dressed with a band-aid.  No follow-ups on file.

## 2023-07-07 DIAGNOSIS — L237 Allergic contact dermatitis due to plants, except food: Secondary | ICD-10-CM | POA: Insufficient documentation

## 2023-07-22 ENCOUNTER — Ambulatory Visit (INDEPENDENT_AMBULATORY_CARE_PROVIDER_SITE_OTHER): Payer: 59 | Admitting: Podiatry

## 2023-07-22 DIAGNOSIS — Q666 Other congenital valgus deformities of feet: Secondary | ICD-10-CM

## 2023-07-22 DIAGNOSIS — M722 Plantar fascial fibromatosis: Secondary | ICD-10-CM | POA: Diagnosis not present

## 2023-07-22 NOTE — Progress Notes (Signed)
  Subjective:  Patient ID: Jeffrey Robbins, male    DOB: 03/29/1976,  MRN: [id number]  Chief Complaint  Patient presents with   Foot Orthotics    47 y.o. male presents with the above complaint.  Patient presents for follow-up of left plantar fasciitis.  He states is doing a lot better he does not any further pain denies any other acute complaints.   Review of Systems: Negative except as noted in the HPI. Denies N/V/F/Ch.  No past medical history on file.  Current Outpatient Medications:    fluticasone (FLONASE) 50 MCG/ACT nasal spray, Place into both nostrils daily., Disp: , Rfl:    ibuprofen (ADVIL) 800 MG tablet, Take 1 tablet (800 mg total) by mouth every 6 (six) hours as needed., Disp: 60 tablet, Rfl: 1   ketorolac (TORADOL) 10 MG tablet, Take 1 tablet (10 mg total) by mouth every 8 (eight) hours as needed., Disp: 21 tablet, Rfl: 0   oxyCODONE-acetaminophen (PERCOCET) 5-325 MG tablet, Take 1 tablet by mouth every 4 (four) hours as needed for severe pain., Disp: 30 tablet, Rfl: 0   traMADol (ULTRAM) 50 MG tablet, Take 1 tablet (50 mg total) by mouth every 12 (twelve) hours as needed., Disp: 15 tablet, Rfl: 0  Social History   Tobacco Use  Smoking Status Every Day   Current packs/day: 0.50   Types: Cigarettes  Smokeless Tobacco Current   Types: Chew    No Known Allergies Objective:  There were no vitals filed for this visit. There is no height or weight on file to calculate BMI. Constitutional Well developed. Well nourished.  Vascular Dorsalis pedis pulses palpable bilaterally. Posterior tibial pulses palpable bilaterally. Capillary refill normal to all digits.  No cyanosis or clubbing noted. Pedal hair growth normal.  Neurologic Normal speech. Oriented to person, place, and time. Epicritic sensation to light touch grossly present bilaterally.  Dermatologic Nails well groomed and normal in appearance. No open wounds. No skin lesions.  Orthopedic: Normal joint ROM  without pain or crepitus bilaterally. No visible deformities. No further tender to palpation at the calcaneal tuber left. No pain with calcaneal squeeze left. Ankle ROM diminished range of motion left. Silfverskiold Test: positive left.   Radiographs: None  Assessment:   No diagnosis found.  Plan:  Patient was evaluated and treated and all questions answered.  Plantar Fasciitis, left -Clinically healed.  Discussed shoe gear modification orthotics management.  Orthotics were dispensed.  If any foot and ankle issues or individual come back and see me  Pes planovalgus -I explained to patient the etiology of pes planovalgus and relationship with Planter fasciitis and various treatment options were discussed.  Given patient foot structure in the setting of Planter fasciitis I believe patient will benefit from custom-made orthotics to help control the hindfoot motion support the arch of the foot and take the stress away from plantar fascial.  Patient agrees with the plan like to proceed with orthotics -Orthotics were dispensed and are functioning well    No follow-ups on file.

## 2023-07-24 ENCOUNTER — Ambulatory Visit: Payer: 59 | Admitting: Podiatry

## 2023-09-23 ENCOUNTER — Ambulatory Visit: Payer: 59 | Admitting: Podiatry

## 2024-01-13 ENCOUNTER — Encounter: Payer: Self-pay | Admitting: Podiatry

## 2024-01-13 ENCOUNTER — Ambulatory Visit (INDEPENDENT_AMBULATORY_CARE_PROVIDER_SITE_OTHER): Payer: 59 | Admitting: Podiatry

## 2024-01-13 DIAGNOSIS — M722 Plantar fascial fibromatosis: Secondary | ICD-10-CM | POA: Diagnosis not present

## 2024-01-13 DIAGNOSIS — M62462 Contracture of muscle, left lower leg: Secondary | ICD-10-CM | POA: Diagnosis not present

## 2024-01-13 NOTE — Progress Notes (Signed)
 Subjective:  Patient ID: Jeffrey Robbins, male    DOB: 1976-01-23,  MRN: [id number]  Chief Complaint  Patient presents with   Plantar Fasciitis    Left foot plantar fasciitis     48 y.o. male presents with the above complaint.  Patient presents with follow-up of left plantar fasciitis he states that it started coming back again.  The injection has helped him considerably.  He has been wearing orthotics denies any other acute complaints   Review of Systems: Negative except as noted in the HPI. Denies N/V/F/Ch.  History reviewed. No pertinent past medical history.  Current Outpatient Medications:    cetirizine (ZYRTEC) 10 MG tablet, Take 1 tablet by mouth daily., Disp: , Rfl:    escitalopram (LEXAPRO) 10 MG tablet, Take 1 tablet by mouth daily., Disp: , Rfl:    PEG 3350-KCl-NaBcb-NaCl-NaSulf (PEG-3350/ELECTROLYTES) 236 g SOLR, SMARTSIG:Milliliter(s) By Mouth, Disp: , Rfl:    fluticasone (FLONASE) 50 MCG/ACT nasal spray, Place into both nostrils daily., Disp: , Rfl:    oxyCODONE-acetaminophen (PERCOCET) 5-325 MG tablet, Take 1 tablet by mouth every 4 (four) hours as needed for severe pain., Disp: 30 tablet, Rfl: 0   traMADol (ULTRAM) 50 MG tablet, Take 1 tablet (50 mg total) by mouth every 12 (twelve) hours as needed., Disp: 15 tablet, Rfl: 0  Social History   Tobacco Use  Smoking Status Every Day   Current packs/day: 0.50   Types: Cigarettes  Smokeless Tobacco Current   Types: Chew    Allergies  Allergen Reactions   Sunscreens Rash    Adult sunscreen   Objective:  There were no vitals filed for this visit. There is no height or weight on file to calculate BMI. Constitutional Well developed. Well nourished.  Vascular Dorsalis pedis pulses palpable bilaterally. Posterior tibial pulses palpable bilaterally. Capillary refill normal to all digits.  No cyanosis or clubbing noted. Pedal hair growth normal.  Neurologic Normal speech. Oriented to person, place, and  time. Epicritic sensation to light touch grossly present bilaterally.  Dermatologic Nails well groomed and normal in appearance. No open wounds. No skin lesions.  Orthopedic: Normal joint ROM without pain or crepitus bilaterally. No visible deformities. Tender to palpation at the calcaneal tuber left. No pain with calcaneal squeeze left. Ankle ROM diminished range of motion left. Silfverskiold Test: positive left.   Radiographs: None  Assessment:   1. Plantar fasciitis, left   2. Gastrocnemius equinus of left lower extremity     Plan:  Patient was evaluated and treated and all questions answered.  Plantar Fasciitis, left~recurrence with underlying gastrocnemius equinus - XR reviewed as above.  - Educated on icing and stretching. Instructions given.  - Injection delivered to the plantar fascia as below. - DME: Plantar fascial brace dispensed to support the medial longitudinal arch of the foot and offload pressure from the heel and prevent arch collapse during weightbearing - Pharmacologic management: None  Pes planovalgus -I explained to patient the etiology of pes planovalgus and relationship with Planter fasciitis and various treatment options were discussed.  Given patient foot structure in the setting of Planter fasciitis I believe patient will benefit from custom-made orthotics to help control the hindfoot motion support the arch of the foot and take the stress away from plantar fascial.  Patient agrees with the plan like to proceed with orthotics -Patient has orthotics and is functioning well   Procedure: Injection Tendon/Ligament Location: Left plantar fascia at the glabrous junction; medial approach. Skin Prep: alcohol Injectate: 0.5 cc 0.5%  marcaine plain, 0.5 cc of 1% Lidocaine, 0.5 cc kenalog 10. Disposition: Patient tolerated procedure well. Injection site dressed with a band-aid.  No follow-ups on file.

## 2024-02-10 ENCOUNTER — Ambulatory Visit (INDEPENDENT_AMBULATORY_CARE_PROVIDER_SITE_OTHER): Payer: 59 | Admitting: Podiatry

## 2024-02-10 DIAGNOSIS — M62462 Contracture of muscle, left lower leg: Secondary | ICD-10-CM

## 2024-02-10 DIAGNOSIS — M722 Plantar fascial fibromatosis: Secondary | ICD-10-CM

## 2024-02-10 NOTE — Progress Notes (Signed)
 Subjective:  Patient ID: Jeffrey Robbins, male    DOB: [birth year],  MRN: [id number]  Chief Complaint  Patient presents with   Plantar Fasciitis    Pt stated that it still bothers him some     48 y.o. male presents with the above complaint.  Patient presents with follow-up of left plantar fasciitis he states that it started coming back again.  The injection has helped him considerably.  He has been wearing orthotics denies any other acute complaints   Review of Systems: Negative except as noted in the HPI. Denies N/V/F/Ch.  No past medical history on file.  Current Outpatient Medications:    cetirizine (ZYRTEC) 10 MG tablet, Take 1 tablet by mouth daily., Disp: , Rfl:    escitalopram (LEXAPRO) 10 MG tablet, Take 1 tablet by mouth daily., Disp: , Rfl:    fluticasone (FLONASE) 50 MCG/ACT nasal spray, Place into both nostrils daily., Disp: , Rfl:    oxyCODONE-acetaminophen (PERCOCET) 5-325 MG tablet, Take 1 tablet by mouth every 4 (four) hours as needed for severe pain., Disp: 30 tablet, Rfl: 0   PEG 3350-KCl-NaBcb-NaCl-NaSulf (PEG-3350/ELECTROLYTES) 236 g SOLR, SMARTSIG:Milliliter(s) By Mouth, Disp: , Rfl:    traMADol (ULTRAM) 50 MG tablet, Take 1 tablet (50 mg total) by mouth every 12 (twelve) hours as needed., Disp: 15 tablet, Rfl: 0  Social History   Tobacco Use  Smoking Status Every Day   Current packs/day: 0.50   Types: Cigarettes  Smokeless Tobacco Current   Types: Chew    Allergies  Allergen Reactions   Sunscreens Rash    Adult sunscreen   Objective:  There were no vitals filed for this visit. There is no height or weight on file to calculate BMI. Constitutional Well developed. Well nourished.  Vascular Dorsalis pedis pulses palpable bilaterally. Posterior tibial pulses palpable bilaterally. Capillary refill normal to all digits.  No cyanosis or clubbing noted. Pedal hair growth normal.  Neurologic Normal speech. Oriented to person, place, and time. Epicritic  sensation to light touch grossly present bilaterally.  Dermatologic Nails well groomed and normal in appearance. No open wounds. No skin lesions.  Orthopedic: Normal joint ROM without pain or crepitus bilaterally. No visible deformities. Tender to palpation at the calcaneal tuber left. No pain with calcaneal squeeze left. Ankle ROM diminished range of motion left. Silfverskiold Test: positive left.   Radiographs: None  Assessment:   No diagnosis found.   Plan:  Patient was evaluated and treated and all questions answered.  Plantar Fasciitis, left~recurrence with underlying gastrocnemius equinus - XR reviewed as above.  - Educated on icing and stretching. Instructions given.  -Second injection delivered to the plantar fascia as below. - DME: Plantar fascial brace dispensed to support the medial longitudinal arch of the foot and offload pressure from the heel and prevent arch collapse during weightbearing - Pharmacologic management: None  Pes planovalgus -I explained to patient the etiology of pes planovalgus and relationship with Planter fasciitis and various treatment options were discussed.  Given patient foot structure in the setting of Planter fasciitis I believe patient will benefit from custom-made orthotics to help control the hindfoot motion support the arch of the foot and take the stress away from plantar fascial.  Patient agrees with the plan like to proceed with orthotics -Patient has orthotics and is functioning well   Procedure: Injection Tendon/Ligament Location: Left plantar fascia at the glabrous junction; medial approach. Skin Prep: alcohol Injectate: 0.5 cc 0.5% marcaine plain, 0.5 cc of 1% Lidocaine, 0.5 cc  kenalog 10. Disposition: Patient tolerated procedure well. Injection site dressed with a band-aid.  No follow-ups on file.

## 2024-07-06 ENCOUNTER — Ambulatory Visit: Admission: EM | Admit: 2024-07-06 | Discharge: 2024-07-06 | Disposition: A | Discharge status: Home / Self Care

## 2024-07-06 DIAGNOSIS — B349 Viral infection, unspecified: Secondary | ICD-10-CM | POA: Diagnosis not present

## 2024-07-06 HISTORY — DX: Allergy, unspecified, initial encounter: T78.40XA

## 2024-07-06 MED ORDER — AZELASTINE HCL 0.1 % NA SOLN: 1.0000 | Freq: Two times a day (BID) | NASAL | 1 refills | Status: AC

## 2024-07-06 MED ORDER — PREDNISONE 20 MG PO TABS: 40.0000 mg | ORAL_TABLET | Freq: Every day | ORAL | 0 refills | Status: AC

## 2024-07-06 NOTE — ED Provider Notes (Signed)
 [facility]  Note:  This document was prepared using Conservation officer, historic buildings and may include unintentional dictation errors.  MRN: [id number] DOB: April 28, 1976  Subjective:   Jeffrey Robbins is a 48 y.o. male presenting for chills, sore throat, headache, body aches, sinus congestion x 3 days.  Patient admits to using Sudafed, DayQuil and NyQuil with minimal improvement.  Patient reports taking a home COVID test yesterday evening that was negative.  Patient reports that his coworker had similar symptoms last Friday and they were working closely together and riding around in the same truck.  No shortness of breath, chest pain, weakness, dizziness.  No current facility-administered medications for this encounter.  Current Outpatient Medications:    azelastine  (ASTELIN ) 0.1 % nasal spray, Place 1 spray into both nostrils 2 (two) times daily. Use in each nostril as directed, Disp: 30 mL, Rfl: 1   cetirizine (ZYRTEC) 10 MG tablet, Take 1 tablet by mouth daily., Disp: , Rfl:    fluticasone (FLONASE) 50 MCG/ACT nasal spray, Place into both nostrils daily., Disp: , Rfl:    predniSONE  (DELTASONE ) 20 MG tablet, Take 2 tablets (40 mg total) by mouth daily for 5 days., Disp: 10 tablet, Rfl: 0   escitalopram (LEXAPRO) 10 MG tablet, Take 1 tablet by mouth daily., Disp: , Rfl:    oxyCODONE -acetaminophen  (PERCOCET) 5-325 MG tablet, Take 1 tablet by mouth every 4 (four) hours as needed for severe pain., Disp: 30 tablet, Rfl: 0   PEG 3350-KCl-NaBcb-NaCl-NaSulf (PEG-3350/ELECTROLYTES) 236 g SOLR, SMARTSIG:Milliliter(s) By Mouth, Disp: , Rfl:    traMADol  (ULTRAM ) 50 MG tablet, Take 1 tablet (50 mg total) by mouth every 12 (twelve) hours as needed., Disp: 15 tablet, Rfl: 0   Allergies  Allergen Reactions   Sunscreens Rash    Adult sunscreen    Past Medical History:  Diagnosis Date   Allergies      History reviewed. No pertinent surgical history.  Family History  Problem Relation Age  of Onset   Healthy Mother    Healthy Father     Social History   Tobacco Use   Smoking status: Every Day    Current packs/day: 0.50    Types: Cigarettes   Smokeless tobacco: Current    Types: Chew  Vaping Use   Vaping status: Never Used  Substance Use Topics   Alcohol use: Yes    Alcohol/week: 10.0 standard drinks of alcohol    Types: 10 Cans of beer per week   Drug use: Never    ROS Refer to HPI for ROS details.  Objective:   Vitals: BP 126/86 (BP Location: Left Arm)   Pulse (!) 104   Temp 99.8 F (37.7 C) (Oral)   Resp 19   Wt 155 lb (70.3 kg)   SpO2 96%   BMI 23.57 kg/m   Physical Exam Vitals and nursing note reviewed.  Constitutional:      General: He is not in acute distress.    Appearance: He is well-developed. He is not ill-appearing or toxic-appearing.  HENT:     Head: Normocephalic.     Nose: Congestion present. No rhinorrhea.     Mouth/Throat:     Mouth: Mucous membranes are moist.     Pharynx: Oropharynx is clear. Posterior oropharyngeal erythema present. No oropharyngeal exudate.  Eyes:     General:        Right eye: No discharge.        Left eye: No discharge.     Extraocular Movements:  Extraocular movements intact.     Conjunctiva/sclera: Conjunctivae normal.  Cardiovascular:     Rate and Rhythm: Normal rate and regular rhythm.     Heart sounds: Normal heart sounds. No murmur heard. Pulmonary:     Effort: Pulmonary effort is normal. No respiratory distress.     Breath sounds: Normal breath sounds. No stridor. No wheezing, rhonchi or rales.  Chest:     Chest wall: No tenderness.  Skin:    General: Skin is warm and dry.  Neurological:     General: No focal deficit present.     Mental Status: He is alert and oriented to person, place, and time.  Psychiatric:        Mood and Affect: Mood normal.        Behavior: Behavior normal.     Procedures  No results found for this or any previous visit (from the past 24 hours).  Assessment  and Plan :     Discharge Instructions       1. Acute viral syndrome (Primary) - azelastine  (ASTELIN ) 0.1 % nasal spray; Place 1 spray into both nostrils 2 (two) times daily. Use in each nostril as directed  Dispense: 30 mL; Refill: 1 - predniSONE  (DELTASONE ) 20 MG tablet; Take 2 tablets (40 mg total) by mouth daily for 5 days.  Dispense: 10 tablet; Refill: 0 - Continue to monitor symptoms for any change in severity if there is any escalation of current symptoms or development of new symptoms such as fever, productive cough, chest pain, shortness of breath follow-up in the ER for further evaluation and treatment.      Shauntavia Brackin B Nyima Vanacker   Shakela Donati, [place], [place] 07/06/24 1102

## 2024-07-06 NOTE — ED Triage Notes (Addendum)
 Patient states that sx started Sunday  Sinus pressure Cold chills Sore throat Headache bodyaches   Sudafed Dayquil and nyquil  Negative at home covid test.

## 2024-07-06 NOTE — Discharge Instructions (Addendum)
  1. Acute viral syndrome (Primary) - azelastine  (ASTELIN ) 0.1 % nasal spray; Place 1 spray into both nostrils 2 (two) times daily. Use in each nostril as directed  Dispense: 30 mL; Refill: 1 - predniSONE  (DELTASONE ) 20 MG tablet; Take 2 tablets (40 mg total) by mouth daily for 5 days.  Dispense: 10 tablet; Refill: 0 - Continue to monitor symptoms for any change in severity if there is any escalation of current symptoms or development of new symptoms such as fever, productive cough, chest pain, shortness of breath follow-up in the ER for further evaluation and treatment.
